# Patient Record
Sex: Male | Born: 1995 | Race: Black or African American | Hispanic: No | Marital: Single | Smoking: Never smoker
Health system: Southern US, Community
[De-identification: ages and names within clinical notes are randomized; demographics above are authoritative.]

---

## 2013-08-02 ENCOUNTER — Emergency Department (HOSPITAL_COMMUNITY)
Admission: EM | Admit: 2013-08-02 | Discharge: 2013-08-02 | Disposition: A | Discharge status: Home / Self Care | Payer: Self-pay | Attending: Emergency Medicine | Admitting: Emergency Medicine

## 2013-08-02 ENCOUNTER — Encounter (HOSPITAL_COMMUNITY): Payer: Self-pay | Admitting: Emergency Medicine

## 2013-08-02 DIAGNOSIS — R1013 Epigastric pain: Secondary | ICD-10-CM | POA: Insufficient documentation

## 2013-08-02 DIAGNOSIS — Z79899 Other long term (current) drug therapy: Secondary | ICD-10-CM | POA: Insufficient documentation

## 2013-08-02 DIAGNOSIS — R109 Unspecified abdominal pain: Secondary | ICD-10-CM

## 2013-08-02 LAB — URINALYSIS, ROUTINE W REFLEX MICROSCOPIC
Bilirubin Urine: NEGATIVE
Ketones, ur: NEGATIVE mg/dL
Leukocytes, UA: NEGATIVE
Nitrite: NEGATIVE
Protein, ur: NEGATIVE mg/dL
Specific Gravity, Urine: 1.025 (ref 1.005–1.030)
Urobilinogen, UA: 1 mg/dL (ref 0.0–1.0)
pH: 6 (ref 5.0–8.0)

## 2013-08-02 LAB — CBC WITH DIFFERENTIAL/PLATELET
Basophils Absolute: 0 10*3/uL (ref 0.0–0.1)
Basophils Relative: 0 % (ref 0–1)
Eosinophils Absolute: 0.6 10*3/uL (ref 0.0–0.7)
Lymphs Abs: 1.6 10*3/uL (ref 0.7–4.0)
MCH: 25.8 pg — ABNORMAL LOW (ref 26.0–34.0)
MCHC: 32.7 g/dL (ref 30.0–36.0)
MCV: 78.9 fL (ref 78.0–100.0)
Neutro Abs: 4.3 10*3/uL (ref 1.7–7.7)
Neutrophils Relative %: 58 % (ref 43–77)
Platelets: 252 10*3/uL (ref 150–400)
RBC: 5.59 MIL/uL — ABNORMAL HIGH (ref 3.87–5.11)

## 2013-08-02 LAB — COMPREHENSIVE METABOLIC PANEL
Albumin: 3.9 g/dL (ref 3.5–5.2)
Alkaline Phosphatase: 89 U/L (ref 39–117)
BUN: 10 mg/dL (ref 6–23)
CO2: 29 mEq/L (ref 19–32)
Creatinine, Ser: 1.01 mg/dL (ref 0.50–1.10)
GFR calc Af Amer: >90 mL/min (ref >90)
Glucose, Bld: 100 mg/dL — ABNORMAL HIGH (ref 70–99)
Potassium: 3.8 mEq/L (ref 3.5–5.1)
Total Protein: 7.2 g/dL (ref 6.0–8.3)

## 2013-08-02 LAB — LIPASE, BLOOD: Lipase: 20 U/L (ref 11–59)

## 2013-08-02 MED ORDER — OMEPRAZOLE 20 MG PO CPDR: 20.0000 mg | DELAYED_RELEASE_CAPSULE | Freq: Every day | ORAL | Status: DC

## 2013-08-02 NOTE — ED Notes (Signed)
Pt discharged.Vital signs stable and GCS 15.Discharge instruction given. 

## 2013-08-02 NOTE — ED Notes (Signed)
Pt presents with 2 day h/o intermittent epigastric pain.  Pt reports pain does not radiate, denies any nausea, vomiting or diarrhea.

## 2013-08-02 NOTE — ED Provider Notes (Signed)
CSN: [id number]     Arrival date & time 08/02/13  1138 History   First MD Initiated Contact with Patient 08/02/13 1152     Chief Complaint  Patient presents with  . Abdominal Pain    Patient is a 17 y.o. male presenting with abdominal pain.  Abdominal Pain Pain location:  Epigastric Pain quality: aching   Pain radiates to:  Does not radiate Pain severity:  Mild Onset quality:  Gradual Duration:  3 days Timing:  Constant (it resolved yesterday but started agin this morning) Context: alcohol use (occsnl but none recently)   Context: not recent illness and not recent travel   Relieved by:  Nothing Worsened by:  Nothing tried Ineffective treatments:  None tried Associated symptoms: no anorexia, no cough, no diarrhea, no dysuria, no fever, no hematuria, no nausea, no shortness of breath and no vomiting     History reviewed. No pertinent past medical history. History reviewed. No pertinent past surgical history. History reviewed. No pertinent family history. History  Substance Use Topics  . Smoking status: Never Smoker   . Smokeless tobacco: Not on file  . Alcohol Use: Yes   OB History   Grav Para Term Preterm Abortions TAB SAB Ect Mult Living                 Review of Systems  Constitutional: Negative for fever.  Respiratory: Negative for cough and shortness of breath.   Gastrointestinal: Positive for abdominal pain. Negative for nausea, vomiting, diarrhea and anorexia.  Genitourinary: Negative for dysuria and hematuria.  All other systems reviewed and are negative.    Allergies  Review of patient's allergies indicates no known allergies.  Home Medications   Current Outpatient Rx  Name  Route  Sig  Dispense  Refill  . omeprazole (PRILOSEC) 20 MG capsule   Oral   Take 1 capsule (20 mg total) by mouth daily.   14 capsule   0    BP 140/70  Pulse 90  Temp(Src) 98.6 F (37 C) (Oral)  Resp 20  Ht 5\' 9"  (1.753 m)  Wt 130 lb (58.968 kg)  BMI 19.19 kg/m2   SpO2 99% Physical Exam  Nursing note and vitals reviewed. Constitutional: She appears well-developed and well-nourished. No distress.  HENT:  Head: Normocephalic and atraumatic.  Right Ear: External ear normal.  Left Ear: External ear normal.  Eyes: Conjunctivae are normal. Right eye exhibits no discharge. Left eye exhibits no discharge. No scleral icterus.  Neck: Neck supple. No tracheal deviation present.  Cardiovascular: Normal rate, regular rhythm and intact distal pulses.   Pulmonary/Chest: Effort normal and breath sounds normal. No stridor. No respiratory distress. She has no wheezes. She has no rales.  Abdominal: Soft. Bowel sounds are normal. She exhibits no distension. There is no tenderness. There is no rebound and no guarding.  Musculoskeletal: She exhibits no edema and no tenderness.  Neurological: She is alert. She has normal strength. No sensory deficit. Cranial nerve deficit:  no gross defecits noted. She exhibits normal muscle tone. She displays no seizure activity. Coordination normal.  Skin: Skin is warm and dry. No rash noted.  Psychiatric: She has a normal mood and affect.    ED Course  Procedures (including critical care time) Labs Review Labs Reviewed  COMPREHENSIVE METABOLIC PANEL - Abnormal; Notable for the following:    Glucose, Bld 100 (*)    GFR calc non Af Amer 80 (*)    All other components within normal limits  CBC  WITH DIFFERENTIAL - Abnormal; Notable for the following:    RBC 5.59 (*)    MCH 25.8 (*)    Monocytes Relative 13 (*)    Eosinophils Relative 8 (*)    All other components within normal limits  LIPASE, BLOOD  URINALYSIS, ROUTINE W REFLEX MICROSCOPIC   Imaging Review No results found.  EKG Interpretation   None       MDM   1. Abdominal pain     Patient's exam is benign. His laboratory tests are unremarkable.  Is not having any trouble with his appetite. He is not having any nausea or vomiting. We'll discharge the patient home  on a course of antacids to see if that helps him at all. I recommend followup with primary-care Dr. if the symptoms persist    Celene Kras, MD 08/02/13 1358

## 2015-11-15 ENCOUNTER — Emergency Department (HOSPITAL_COMMUNITY): Payer: Self-pay

## 2015-11-15 ENCOUNTER — Emergency Department (HOSPITAL_COMMUNITY)
Admission: EM | Admit: 2015-11-15 | Discharge: 2015-11-15 | Disposition: A | Discharge status: Home / Self Care | Payer: Self-pay | Attending: Emergency Medicine | Admitting: Emergency Medicine

## 2015-11-15 ENCOUNTER — Encounter (HOSPITAL_COMMUNITY): Payer: Self-pay | Admitting: Emergency Medicine

## 2015-11-15 DIAGNOSIS — Z79899 Other long term (current) drug therapy: Secondary | ICD-10-CM | POA: Insufficient documentation

## 2015-11-15 DIAGNOSIS — R519 Headache, unspecified: Secondary | ICD-10-CM

## 2015-11-15 DIAGNOSIS — Z8679 Personal history of other diseases of the circulatory system: Secondary | ICD-10-CM | POA: Insufficient documentation

## 2015-11-15 DIAGNOSIS — M542 Cervicalgia: Secondary | ICD-10-CM | POA: Insufficient documentation

## 2015-11-15 DIAGNOSIS — R5381 Other malaise: Secondary | ICD-10-CM | POA: Insufficient documentation

## 2015-11-15 DIAGNOSIS — R51 Headache: Secondary | ICD-10-CM | POA: Insufficient documentation

## 2015-11-15 DIAGNOSIS — H53149 Visual discomfort, unspecified: Secondary | ICD-10-CM | POA: Insufficient documentation

## 2015-11-15 LAB — CBC WITH DIFFERENTIAL/PLATELET
BASOS ABS: 0 10*3/uL (ref 0.0–0.1)
Basophils Relative: 0 %
EOS PCT: 5 %
Eosinophils Absolute: 0.2 10*3/uL (ref 0.0–0.7)
HCT: 43.2 % (ref 39.0–52.0)
Hemoglobin: 14 g/dL (ref 13.0–17.0)
Lymphocytes Relative: 34 %
Lymphs Abs: 1.7 10*3/uL (ref 0.7–4.0)
MCH: 26 pg (ref 26.0–34.0)
MCHC: 32.4 g/dL (ref 30.0–36.0)
MCV: 80.1 fL (ref 78.0–100.0)
Monocytes Absolute: 0.6 10*3/uL (ref 0.1–1.0)
Monocytes Relative: 12 %
Neutro Abs: 2.4 10*3/uL (ref 1.7–7.7)
Neutrophils Relative %: 50 %
PLATELETS: 279 10*3/uL (ref 150–400)
RBC: 5.39 MIL/uL (ref 4.22–5.81)
RDW: 14.1 % (ref 11.5–15.5)
WBC: 4.9 10*3/uL (ref 4.0–10.5)

## 2015-11-15 LAB — BASIC METABOLIC PANEL
Anion gap: 7 (ref 5–15)
BUN: 12 mg/dL (ref 6–20)
CO2: 26 mmol/L (ref 22–32)
CREATININE: 0.95 mg/dL (ref 0.61–1.24)
Calcium: 9.3 mg/dL (ref 8.9–10.3)
Chloride: 105 mmol/L (ref 101–111)
GFR calc Af Amer: >60 mL/min (ref >60)
GFR calc non Af Amer: >60 mL/min (ref >60)
Glucose, Bld: 87 mg/dL (ref 65–99)
Potassium: 3.9 mmol/L (ref 3.5–5.1)
SODIUM: 138 mmol/L (ref 135–145)

## 2015-11-15 LAB — URINALYSIS, ROUTINE W REFLEX MICROSCOPIC
Bilirubin Urine: NEGATIVE
Glucose, UA: NEGATIVE mg/dL
Hgb urine dipstick: NEGATIVE
KETONES UR: NEGATIVE mg/dL
LEUKOCYTES UA: NEGATIVE
Nitrite: NEGATIVE
Protein, ur: NEGATIVE mg/dL
Specific Gravity, Urine: 1.02 (ref 1.005–1.030)
pH: 8 (ref 5.0–8.0)

## 2015-11-15 MED ORDER — DEXAMETHASONE SODIUM PHOSPHATE 10 MG/ML IJ SOLN
10.0000 mg | Freq: Once | INTRAMUSCULAR | Status: AC
  Administered 2015-11-15: 10 mg via INTRAVENOUS
  Filled 2015-11-15: qty 1

## 2015-11-15 MED ORDER — DIPHENHYDRAMINE HCL 50 MG/ML IJ SOLN
25.0000 mg | Freq: Once | INTRAMUSCULAR | Status: AC
  Administered 2015-11-15: 25 mg via INTRAVENOUS
  Filled 2015-11-15: qty 1

## 2015-11-15 MED ORDER — IBUPROFEN 800 MG PO TABS: 800.0000 mg | ORAL_TABLET | Freq: Three times a day (TID) | ORAL | Status: AC

## 2015-11-15 MED ORDER — LIDOCAINE VISCOUS 2 % MT SOLN: 15.0000 mL | Freq: Once | OROMUCOSAL | Status: DC

## 2015-11-15 MED ORDER — METOCLOPRAMIDE HCL 5 MG/ML IJ SOLN
5.0000 mg | Freq: Once | INTRAMUSCULAR | Status: AC
  Administered 2015-11-15: 5 mg via INTRAVENOUS
  Filled 2015-11-15: qty 2

## 2015-11-15 NOTE — Discharge Instructions (Signed)
General Headache Without Cause A headache is pain or discomfort felt around the head or neck area. The specific cause of a headache may not be found. There are many causes and types of headaches. A few common ones are:  Tension headaches.  Migraine headaches.  Cluster headaches.  Chronic daily headaches. HOME CARE INSTRUCTIONS  Watch your condition for any changes. Take these steps to help with your condition: Managing Pain  Take over-the-counter and prescription medicines only as told by your health care provider.  Lie down in a dark, quiet room when you have a headache.  If directed, apply ice to the head and neck area:  Put ice in a plastic bag.  Place a towel between your skin and the bag.  Leave the ice on for 20 minutes, 2-3 times per day.  Use a heating pad or hot shower to apply heat to the head and neck area as told by your health care provider.  Keep lights dim if bright lights bother you or make your headaches worse. Eating and Drinking  Eat meals on a regular schedule.  Limit alcohol use.  Decrease the amount of caffeine you drink, or stop drinking caffeine. General Instructions  Keep all follow-up visits as told by your health care provider. This is important.  Keep a headache journal to help find out what may trigger your headaches. For example, write down:  What you eat and drink.  How much sleep you get.  Any change to your diet or medicines.  Try massage or other relaxation techniques.  Limit stress.  Sit up straight, and do not tense your muscles.  Do not use tobacco products, including cigarettes, chewing tobacco, or e-cigarettes. If you need help quitting, ask your health care provider.  Exercise regularly as told by your health care provider.  Sleep on a regular schedule. Get 7-9 hours of sleep, or the amount recommended by your health care provider. SEEK MEDICAL CARE IF:   Your symptoms are not helped by medicine.  You have a  headache that is different from the usual headache.  You have nausea or you vomit.  You have a fever. SEEK IMMEDIATE MEDICAL CARE IF:   Your headache becomes severe.  You have repeated vomiting.  You have a stiff neck.  You have a loss of vision.  You have problems with speech.  You have pain in the eye or ear.  You have muscular weakness or loss of muscle control.  You lose your balance or have trouble walking.  You feel faint or pass out.  You have confusion.   This information is not intended to replace advice given to you by your health care provider. Make sure you discuss any questions you have with your health care provider.   Document Released: 09/20/2005 Document Revised: 06/11/2015 Document Reviewed: 01/13/2015 Elsevier Interactive Patient Education 2016 [employer].   Emergency Department Resource Guide 1) Find a Doctor and Pay Out of Pocket Although you won't have to find out who is covered by your insurance plan, it is a good idea to ask around and get recommendations. You will then need to call the office and see if the doctor you have chosen will accept you as a new patient and what types of options they offer for patients who are self-pay. Some doctors offer discounts or will set up payment plans for their patients who do not have insurance, but you will need to ask so you aren't surprised when you get to your  appointment.  2) Contact Your Local Health Department Not all health departments have doctors that can see patients for sick visits, but many do, so it is worth a call to see if yours does. If you don't know where your local health department is, you can check in your phone book. The CDC also has a tool to help you locate your state's health department, and many state websites also have listings of all of their local health departments.  3) Find a Walk-in Clinic If your illness is not likely to be very severe or complicated, you may want to try a walk  in clinic. These are popping up all over the country in pharmacies, drugstores, and shopping centers. They're usually staffed by nurse practitioners or physician assistants that have been trained to treat common illnesses and complaints. They're usually fairly quick and inexpensive. However, if you have serious medical issues or chronic medical problems, these are probably not your best option.  No Primary Care Doctor: - Call Health Connect at  [phone] - they can help you locate a primary care doctor that  accepts your insurance, provides certain services, etc. - Physician Referral Service- [phone]  Chronic Pain Problems: Organization         Address  Phone   Notes  [facility]  [phone] Patients need to be referred by their primary care doctor.   Medication Assistance: Organization         Address  Phone   Notes  [facility] Medication [facility] [address] [place], [place] [address] [phone] --Must be a resident of [facility] -- Must have NO insurance coverage whatsoever (no Medicaid/ Medicare, etc.) -- The pt. MUST have a primary care doctor that directs their care regularly and follows them in the community   [facility]ist  [phone]   [employer]  [phone]    Agencies that provide inexpensive medical care: Organization         Address  Phone   Notes  [facility]  [phone]   [facility]    [phone]   [facility] [address] [place], [place] [address] [phone]   [facility] [address] [place], [place] [phone]   Planned Parenthood    [phone]   [facility]    [phone]   Community Health and [facility]  [address], [place] Phone:  [phone], Fax:  [phone] Hours of Operation:  9 am - 6 pm, M-F.  Also accepts Medicaid/Medicare and self-pay.  [facility] for [facility]  [address], [place] Phone: [phone], Fax: [phone]. Hours of Operation:  8:30 am - 5:30 pm, M-F.  Also accepts Medicaid and self-pay.  [facility] [place] [address], [place] Phone: [phone]   [facility] [address] Natasha Bence [place], [place] [phone], Ext. [phone] Mondays & Thursdays: 7-9 AM.  First 15 patients are seen on a first come, first serve basis.    Medicaid-accepting [facility] Providers:  Organization         Address  Phone   Notes  [facility] [address], [place] [phone] Also accepts self-pay patients.  [facility] [address] Laurell Josephs [place], [place]  [phone]   [facility] [address], [place] [phone]) [phone]   [facility] [address] [place] [address], [place] [phone]) [phone]   Renaye RakersVeita Bland [address], [place]   [phone]) [phone] Only accepts [place] Access [place] patients after they have their name applied to their card.   Self-Pay (no insurance) in [facility]:  Organization         Address  Phone   Notes  Sickle Cell Patients, [facility] [address], [place] [phone]) [phone]   [facility] [address], [place] [phone]) [phone]   [facility] [place]  [address], [place] [phone]   [facility]/Dr. Osei-Bonsu  [address] [place] [address], [place] or [phone], [place] [phone] Phone number for both [place] and [place] locations is the same.  [facility] and [facility] [address], [place] [phone]) [phone]   [facility] [place] [address] [place] [address], [place] or [address] [phone](336) [phone]   [facility] [address], [place] [phone]) [phone], phone; [phone](336) [phone], fax Sees  patients 1st and 3rd Saturday of every month.  Must not qualify for public or private insurance (i.e. Medicaid, Medicare, [place] Health Choice, Veterans' Benefits)  Household income should be no more than 200% of the poverty level The clinic cannot treat you if you are pregnant or think you are pregnant  Sexually transmitted diseases are not treated at the clinic.    Dental Care: Organization         Address  Phone  Notes  [facility] Department of [facility] [address], [place] [phone]) [phone] Accepts children up to age 321 who are enrolled in [place] or [place] Health Choice; pregnant women with a Medicaid card; and children who have applied for Medicaid or [place] Health Choice, but were declined, whose parents can pay a reduced fee at time of service.  [facility] [place]  [address], [place] [phone]) [phone] Accepts children up to age 22 who are enrolled in [place] or [place] Health Choice; pregnant women with a Medicaid card; and children who have applied for Medicaid or  Health Choice, but were declined, whose parents can pay a reduced fee at time of service.  [facility]  [address], [place] [phone]) [phone] Patients are seen by appointment only. Walk-ins are not accepted. [facility] will see patients 22 years of age and older. Monday - Tuesday (8am-5pm) Most Wednesdays (8:30-5pm) $30 per visit, cash only  [facility]  [address], [facility] [place] [phone] Patients are seen by appointment only. Walk-ins are not accepted. [facility] will see patients 22 years of age and older. One Wednesday Evening (Monthly: Volunteer Based).  $30 per visit, cash only  [employer] [facility]  [phone] for adults; Children under age 524, call Graduate Pediatric Dentistry at [phone]. Children aged 334-14, please call [phone] to request a  pediatric application.  Dental services are provided in all areas of dental care including fillings, crowns and bridges, complete and partial dentures, implants, gum treatment, root canals, and extractions. Preventive care is also provided. Treatment is provided to both adults and children. Patients are selected via a lottery and there is often a waiting list.   [facility] [address], Ginette Otto  [phone] [email or web address]   [facility] [address] [place], [place] [phone], Ext. [phone] Second and Fourth Thursday of each month, opens at 6:30 AM; Clinic ends at 9 AM.  Patients are seen on a first-come first-served basis, and a limited number are seen during each clinic.   [facility]  [address] Ether Griffins [place], [place] [phone]   Eligibility Requirements You must have lived in [place], [place], or [place] counties for at least the last three months.   You cannot be eligible for state or federal sponsored [place], including [employer], [place], or [employer].   You generally cannot be eligible for healthcare insurance through your employer.    How to apply: Eligibility screenings are held every Tuesday and Wednesday afternoon from 1:00 pm until 4:00 pm. You do not need an appointment for the interview!  [facility] [address], [place], [place] [phone]   [facility] Health Department  [phone]   [facility] Health Department  [phone]   [facility] Health Department  [phone]    Behavioral Health Resources in the Community: Intensive Outpatient Programs Organization         Address  Phone  Notes  [facility] [address] [place], [place] [phone]   [facility] Outpatient [address], [place], [place] [phone]   [facility]: [facility] [address], [place], [place]  [phone]   [facility]  Mental Health [address]  [place], [place] [phone] or [phone]   Substance Abuse Resources Organization         Address  Phone  Notes  Al[facility] Drug Services  [phone]   [facility]  [phone]   [facility]  [phone]   Floydene Flock  [phone]   Residential & Outpatient Substance Abuse Program  [phone]   Psychological Services Organization         Address  Phone  Notes  [facility]  [phone]   [facility]  [phone]   [facility] [address], [place] [phone] or [phone]    Mobile Crisis Teams Organization         Address  Phone  Notes  [facility], Mobile Crisis Care Unit  [phone]   [facility]  [address] [place], [place] [phone]   Doristine Locks [address] [place] [phone]    Self-Help/Support Groups Organization         Address  Phone             Notes  [facility] [place] - variety of support groups  336- I7437963 Call for more information  Narcotics Anonymous (NA), [facility] [address], [employer] [place]  2 meetings at this location   Statistician         Address  Phone  Notes  [facility] [address],    [place]  [phone]   [facility]  [address] [place] [address], [place], [place] [phone]   [facility] [address] [place], [place] [phone] Admissions: 8am-3pm M-F  [facility] [address],    [place], [place] [phone]   [facility] [address] [place], [place], [place] [phone]   [facility] [address]  [place], [place] [phone]  [facility] - Intensive Outpatient [address], [place], [place] [phone]   [facility] ([facility] [address]    [place], [place] [phone] or [phone]   [facility] ([facility]) [address] [place], [place] [phone] Accepts Medicaid  [facility] [address]  [place] [phone] Substance Abuse/Addiction Treatment   [facility]al Health Resources Organization         Address  Phone  Notes  [facility]  [phone]) [phone]   Angie FavaJulie Brannon, PhD [address], [address] [place], [place]   [phone]) [phone] or [phone](336) [phone]   [facility]   [address] [place], [place] [phone](336) [phone]   [facility] [address], [place], [place] [phone]) [phone] Insurance/Medicaid/sponsorship through Dr. [facility]  [facility] [address]                                    [place], [place] [phone]) [phone] Therapy/tele-psych/case  [facility] [address]   [place], [place] [phone](336) [phone]    Dr. Lolly MustacheArfeen  [phone]) [phone]   [facility] [place]  [employer] [facility] 1) [address], [place] 2) [address], [place] 3)  [address], [place] [phone]) [phone](336) [phone]  [phone](336) [phone]   [facility] Child Abuse Hotline [phone]) [phone] or [phone](336) [phone] (After Hours)

## 2015-11-15 NOTE — ED Notes (Signed)
Pt complaint of continued headache for 3 weeks; unrelieved by motrin.

## 2015-11-15 NOTE — ED Provider Notes (Signed)
CSN: [id number]     Arrival date & time 11/15/15  1304 History  By signing my name below, I, [facility], attest that this documentation has been prepared under the direction and in the presence of William Fowler, PA-C. Electronically Signed: Randell [facility], ED Scribe. 11/15/2015. 3:26 PM.   Chief Complaint  Ashley presents with  . Headache   The history is provided by the Ashley. No language interpreter was used.   HPI Comments: William [facility] is a 20 y.o. male who presents to the Emergency Department complaining of intermittent, mild, ongoing, aching generalized HAs, occassionally worse in bilateral temples onset 3 weeks ago. Ashley reports no recent falls or injuries but states that he has been having difficulty focusing particularly in class. He endorses associated mild photophobia, general malaise, intermittent neck pain, and "feeling off balance" sometimes. Pain is worse with leaning forward and improved by nothing. He has taken ibuprofen 2 pills BID without relief. Per Ashley, he notes hx of migraines in the past when he was child, but notes that current complaint is longer in duration. He denies fever, neck stiffness, nausea, vomiting, double vision, abdominal pain, dysuria, hematuria, and sleep disturbance.  Onset 2-3 weeks ago. Denies recent injuries or falls. History reviewed. No pertinent past medical history. History reviewed. No pertinent past surgical history. No family history on file. Social History  Substance Use Topics  . Smoking status: Never Smoker   . Smokeless tobacco: None  . Alcohol Use: Yes    Review of Systems All other systems negative except as noted in HPI.   Allergies  Review of Ashley's allergies indicates no known allergies.  Home Medications   Prior to Admission medications   Medication Sig Start Date End Date Taking? Authorizing Provider  omeprazole (PRILOSEC) 20 MG capsule Take 1 capsule (20 mg total) by mouth daily. 08/02/13   Linwood Dibbles, MD   BP 126/72 mmHg  Pulse 71  Temp(Src) 98.3 F (36.8 C) (Oral)  Resp 18  SpO2 100% Physical Exam  Constitutional: He is oriented to person, place, and time. He appears well-developed and well-nourished.  Non-toxic appearance. He does not have a sickly appearance. He does not appear ill.  HENT:  Head: Normocephalic and atraumatic.  Right Ear: External ear normal.  Left Ear: External ear normal.  Mouth/Throat: Uvula is midline, oropharynx is clear and moist and mucous membranes are normal. No trismus in the jaw. No uvula swelling. No tonsillar abscesses.  Eyes: Conjunctivae are normal. Pupils are equal, round, and reactive to light. No scleral icterus.  Neck: Normal range of motion. Neck supple. No tracheal deviation present.  Cardiovascular: Normal rate, regular rhythm and normal heart sounds.   No murmur heard. Pulmonary/Chest: Effort normal and breath sounds normal. No accessory muscle usage or stridor. No respiratory distress. He has no wheezes. He has no rhonchi. He has no rales.  Abdominal: Soft. Bowel sounds are normal. He exhibits no distension. There is no tenderness.  Musculoskeletal: Normal range of motion.  Lymphadenopathy:    He has no cervical adenopathy.  Neurological: He is alert and oriented to person, place, and time.  Mental Status:   AOx3.  Speech clear without dysarthria. Cranial Nerves:  I-not tested  II-PERRLA  III, IV, VI-EOMs intact  V-temporal and masseter strength intact  VII-symmetrical facial movements intact, no facial droop  VIII-hearing grossly intact bilaterally  IX, X-gag intact  XI-strength of sternomastoid and trapezius muscles 5/5  XII-tongue midline Motor:   Good muscle bulk and tone  Strength 5/5 bilaterally in upper and lower extremities   Cerebellar--intact RAMs, finger to nose intact bilaterally.  Gait normal  No pronator drift Sensory:  Intact in upper and lower extremities   Skin: Skin is warm and dry.  Psychiatric: He  has a normal mood and affect. His behavior is normal.    ED Course  Procedures   DIAGNOSTIC STUDIES: Oxygen Saturation is 100% on RA, normal by my interpretation.    COORDINATION OF CARE: 1:39 PM Discussed ordering a head CT and pt agreed. Will order IV, Reglan, Benadryl, Decadron, and labs. Discussed treatment plan with pt at bedside and pt agreed to plan.  Labs Review Labs Reviewed  BASIC METABOLIC PANEL  CBC WITH DIFFERENTIAL/PLATELET  URINALYSIS, ROUTINE W REFLEX MICROSCOPIC (NOT AT [facility])    Imaging Review Ct Head Wo Contrast  11/15/2015  CLINICAL DATA:  Three-week history of frontal region headache EXAM: CT HEAD WITHOUT CONTRAST TECHNIQUE: Contiguous axial images were obtained from the base of the skull through the vertex without intravenous contrast. COMPARISON:  None. FINDINGS: The ventricles are normal in size and configuration. There is no intracranial mass, hemorrhage, extra-axial fluid collection, or midline shift. Gray-white compartments are normal. No acute infarct evident. Bony calvarium appears intact. The mastoid air cells are clear. No intraorbital lesions are identified. There is probable cerumen in each external auditory canal. IMPRESSION: Probable cerumen in each external auditory canal. No intracranial mass, hemorrhage, or focal gray - white compartment lesions/acute appearing infarct. Electronically Signed   By: Bretta Bang III M.D.   On: 11/15/2015 14:33   I have personally reviewed and evaluated these images and lab results as part of my medical decision-making.   EKG Interpretation None      MDM   Final diagnoses:  Nonintractable headache, unspecified chronicity pattern, unspecified headache type    Ashley presents with generalized headache. Non focal neuro exam. No recent head trauma. No fever. Doubt meningitis. Doubt intracranial bleed. Doubt normal pressure hydrocephalus. Given lack of history of this type of headache, will obtain CT head, CBC  and BMP.  Will treat with migraine cocktail and reevaluate.  CT head unremarkable.  BMP and CBC unremarkable.  UA negative.    I suspect primary headaches.  Plan to discharge home with 800 mg ibuprofen TID PRN.  Follow up PCP.  Discussed return precautions.  Ashley agrees and acknowledges the above plan for discharge.   I personally performed the services described in this documentation, which was scribed in my presence. The recorded information has been reviewed and is accurate.   William Fowler, PA-C 11/15/15 1539  William Fowler, PA-C 11/15/15 1545  Richardean Canal, MD 11/16/15 [phone]

## 2017-01-29 IMAGING — CT CT HEAD W/O CM
2 series · 17 of 30 positions shown, 20 images · non-contrast
Comparison: None.

CLINICAL DATA: Three-week history of frontal region headache

EXAM:
CT HEAD WITHOUT CONTRAST
TECHNIQUE: Contiguous axial images were obtained from the base of the skull
through the vertex without intravenous contrast.

[Series 2: head w/o · axial · non-contrast · 0.45mm/px · z∈[-129,-9]mm · 9 of 31 slices shown, 12 images]
[im 4/31  brain]
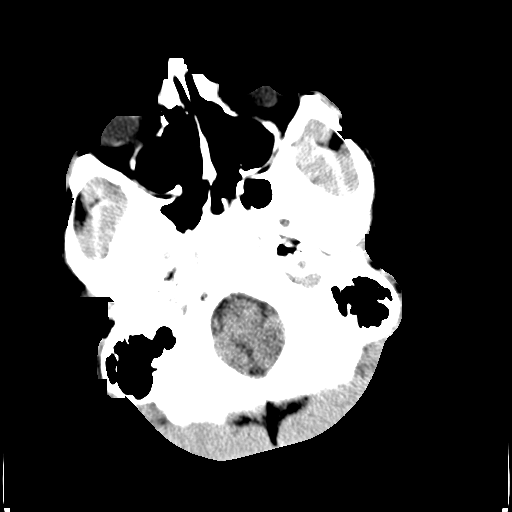
[im 4/31  bone]
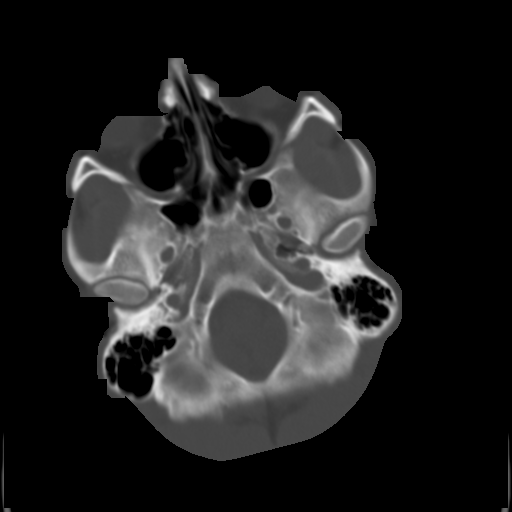
[im 7/31  brain]
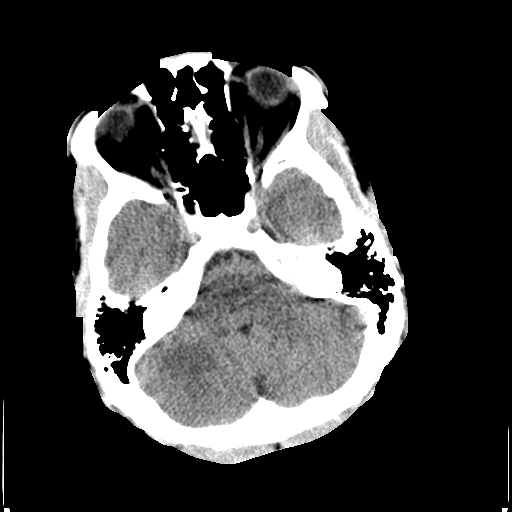
[im 10/31  brain]
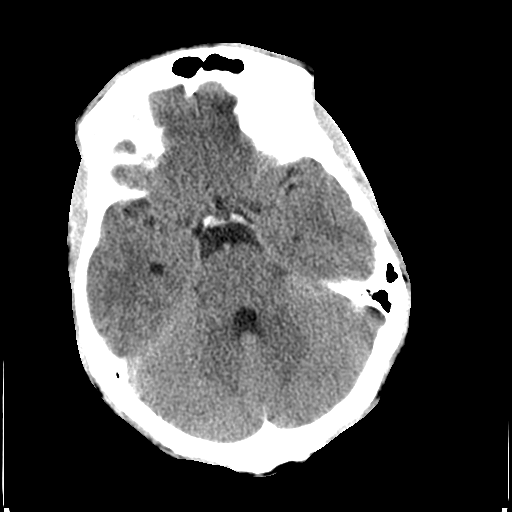
[im 13/31  brain]
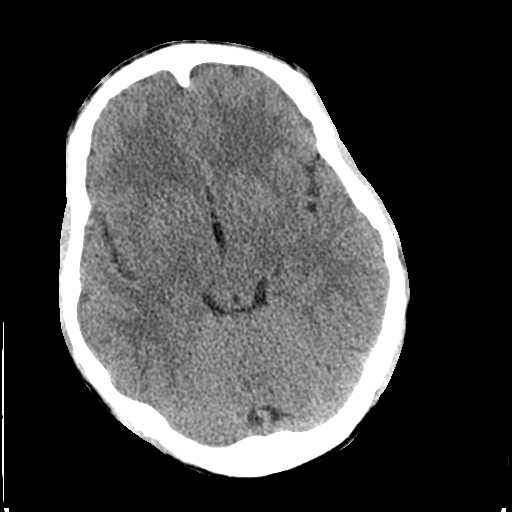
[im 16/31  brain]
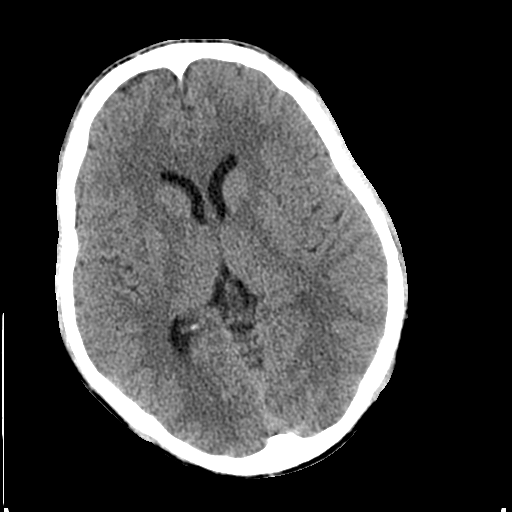
[im 16/31  bone]
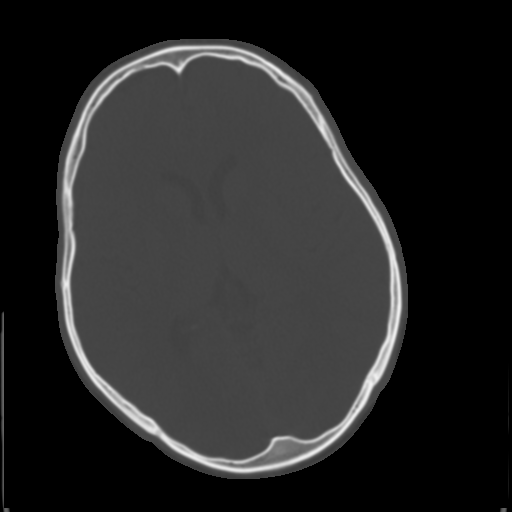
[im 19/31  brain]
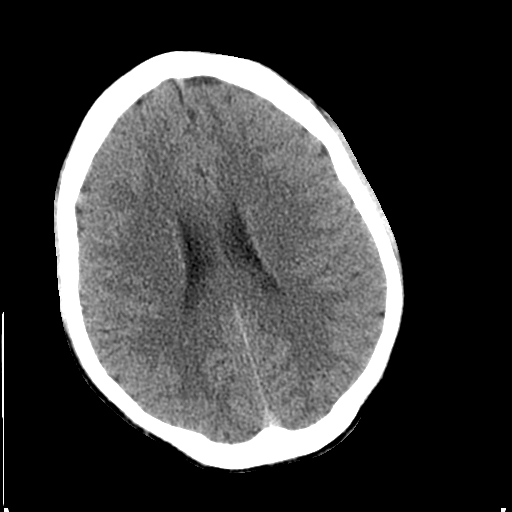
[im 22/31  brain]
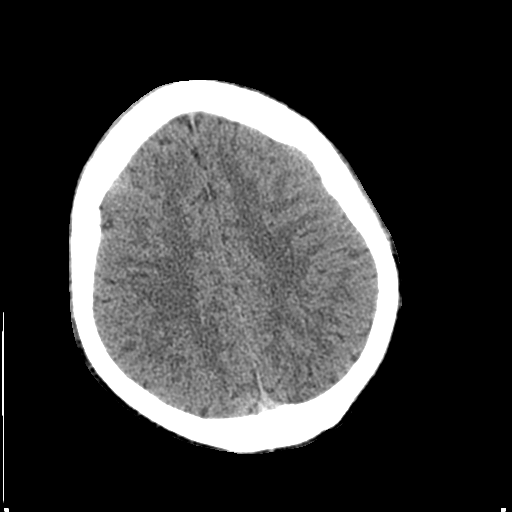
[im 25/31  brain]
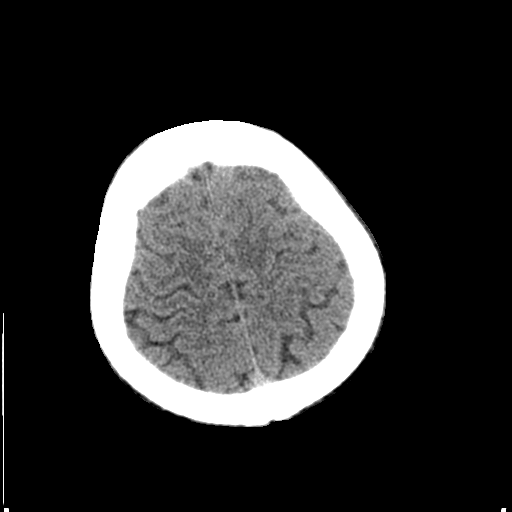
[im 28/31  brain]
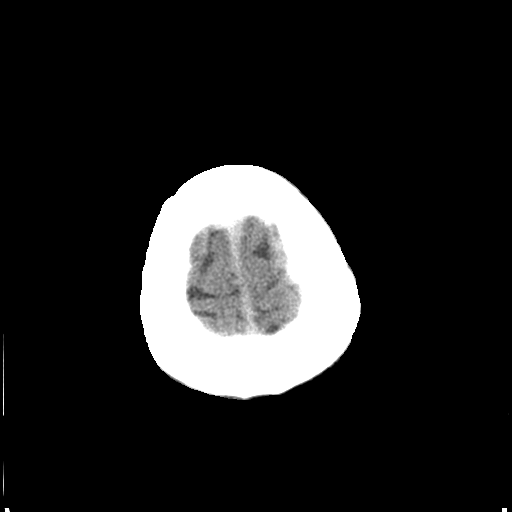
[im 28/31  bone]
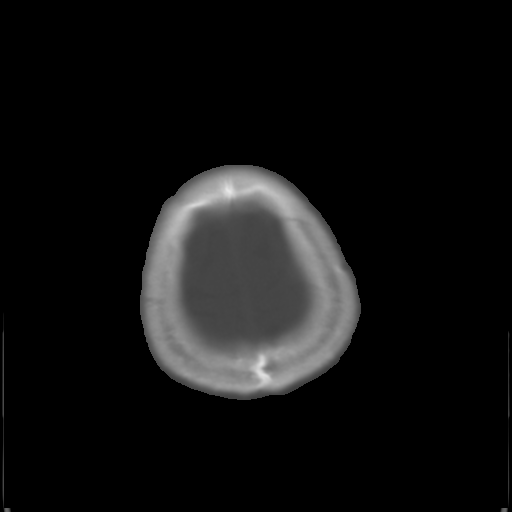

[Series 3: bone windows · axial · 0.45mm/px · z∈[-129,-9]mm · 8 of 52 slices shown]
[im 6/52  bone]
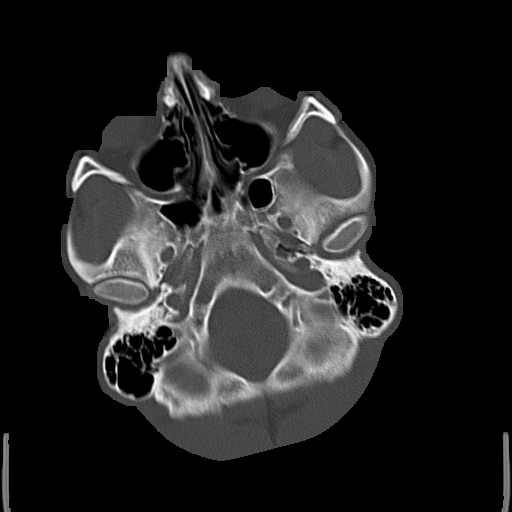
[im 12/52  bone]
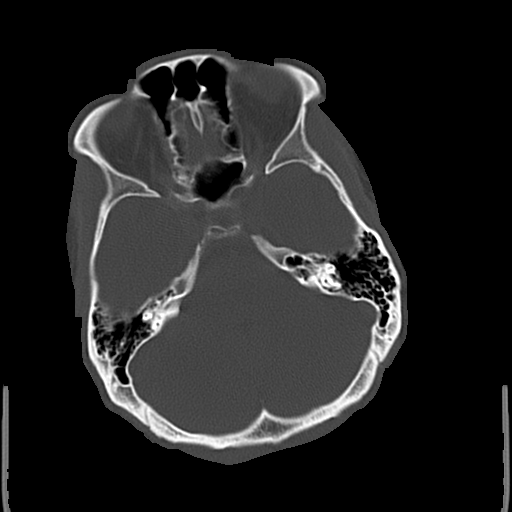
[im 18/52  bone]
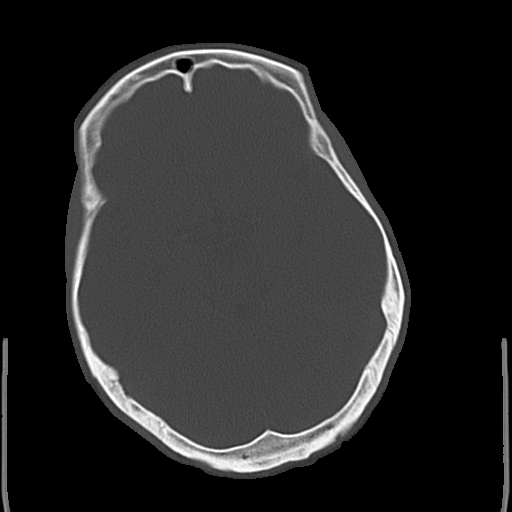
[im 23/52  bone]
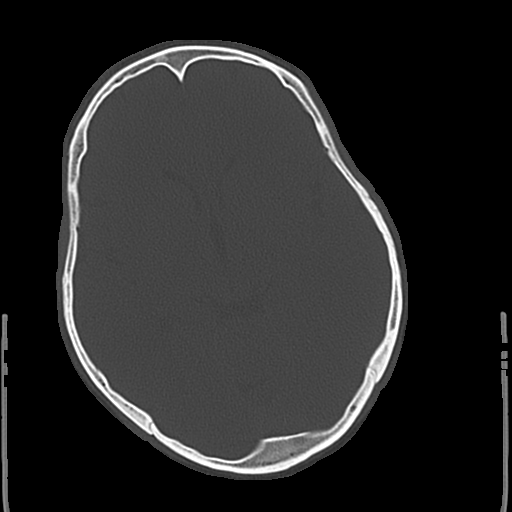
[im 29/52  bone]
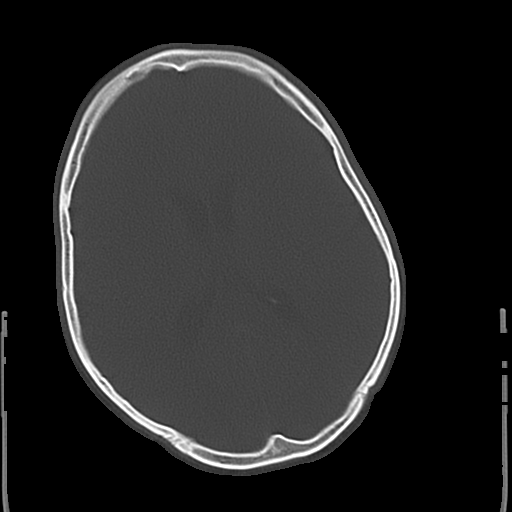
[im 35/52  bone]
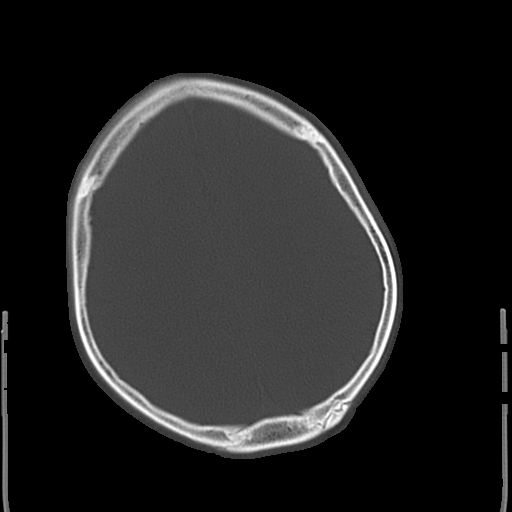
[im 40/52  bone]
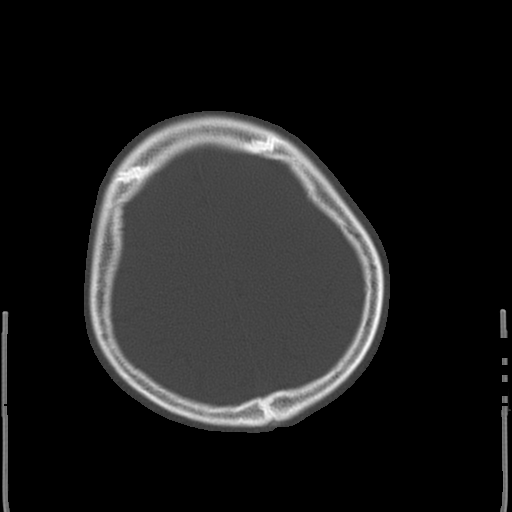
[im 46/52  bone]
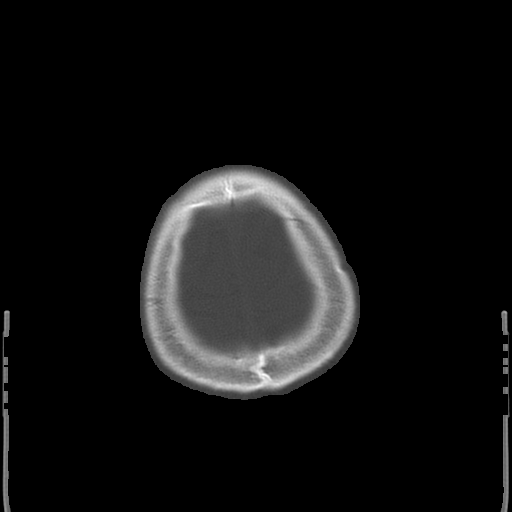

[17 of 30 positions shown; findings below may reference images not displayed]

FINDINGS: The ventricles are normal in size and configuration. There is no
intracranial mass, hemorrhage, extra-axial fluid collection, or
midline shift. Gray-white compartments are normal. No acute infarct
evident. Bony calvarium appears intact. The mastoid air cells are
clear. No intraorbital lesions are identified. There is probable
cerumen in each external auditory canal.
IMPRESSION: Probable cerumen in each external auditory canal. No intracranial
mass, hemorrhage, or focal gray - white compartment lesions/acute
appearing infarct.

## 2019-10-02 ENCOUNTER — Emergency Department (HOSPITAL_COMMUNITY)
Admission: EM | Admit: 2019-10-02 | Discharge: 2019-10-02 | Disposition: A | Discharge status: Home / Self Care | Payer: Self-pay | Attending: Emergency Medicine | Admitting: Emergency Medicine

## 2019-10-02 ENCOUNTER — Ambulatory Visit
Admission: EM | Admit: 2019-10-02 | Discharge: 2019-10-02 | Disposition: A | Discharge status: Home / Self Care | Payer: Self-pay | Attending: Emergency Medicine | Admitting: Emergency Medicine

## 2019-10-02 ENCOUNTER — Encounter: Payer: Self-pay | Admitting: Emergency Medicine

## 2019-10-02 ENCOUNTER — Encounter (HOSPITAL_COMMUNITY): Payer: Self-pay

## 2019-10-02 ENCOUNTER — Other Ambulatory Visit: Payer: Self-pay

## 2019-10-02 DIAGNOSIS — Y288XXA Contact with other sharp object, undetermined intent, initial encounter: Secondary | ICD-10-CM | POA: Insufficient documentation

## 2019-10-02 DIAGNOSIS — Z23 Encounter for immunization: Secondary | ICD-10-CM | POA: Insufficient documentation

## 2019-10-02 DIAGNOSIS — W458XXA Other foreign body or object entering through skin, initial encounter: Secondary | ICD-10-CM

## 2019-10-02 DIAGNOSIS — Y999 Unspecified external cause status: Secondary | ICD-10-CM | POA: Insufficient documentation

## 2019-10-02 DIAGNOSIS — Y93G1 Activity, food preparation and clean up: Secondary | ICD-10-CM | POA: Insufficient documentation

## 2019-10-02 DIAGNOSIS — Y92000 Kitchen of unspecified non-institutional (private) residence as  the place of occurrence of the external cause: Secondary | ICD-10-CM | POA: Insufficient documentation

## 2019-10-02 DIAGNOSIS — S61111A Laceration without foreign body of right thumb with damage to nail, initial encounter: Secondary | ICD-10-CM

## 2019-10-02 MED ORDER — TETANUS-DIPHTH-ACELL PERTUSSIS 5-2.5-18.5 LF-MCG/0.5 IM SUSP
0.5000 mL | Freq: Once | INTRAMUSCULAR | Status: AC
  Administered 2019-10-02: 19:00:00 0.5 mL via INTRAMUSCULAR
  Filled 2019-10-02: qty 0.5

## 2019-10-02 MED ORDER — LIDOCAINE HCL (PF) 1 % IJ SOLN
5.0000 mL | Freq: Once | INTRAMUSCULAR | Status: AC
  Administered 2019-10-02: 5 mL

## 2019-10-02 NOTE — ED Provider Notes (Signed)
..  Laceration Repair  Date/Time: 10/02/2019 6:09 PM Performed by: Maudie Flakes, MD Authorized by: Maudie Flakes, MD   Consent:    Consent obtained:  Verbal   Consent given by:  Patient   Risks discussed:  Pain, poor cosmetic result, poor wound healing, vascular damage, tendon damage and infection Laceration details:    Location:  Finger   Finger location:  R thumb   Length (cm):  1.5   Depth (mm):  2 Repair type:    Repair type:  Simple Pre-procedure details:    Preparation:  Patient was prepped and draped in usual sterile fashion Exploration:    Hemostasis achieved with:  Tourniquet   Wound exploration: wound explored through full range of motion and entire depth of wound probed and visualized     Contaminated: no   Treatment:    Area cleansed with:  Saline Skin repair:    Repair method:  Sutures   Suture size:  5-0   Wound skin closure material used: Fast-absorbing Vicryl.   Number of sutures:  1 Approximation:    Approximation:  Close Post-procedure details:    Dressing:  Open (no dressing)   Patient tolerance of procedure:  Tolerated well, no immediate complications Comments:     Single figure-of-eight stitch placed to achieve hemostasis of small arterial bleed within the wound.      Maudie Flakes, MD 10/02/19 [phone]

## 2019-10-02 NOTE — ED Triage Notes (Addendum)
Pt presents to [facility] for assessment of laceration to tip of right thumb after slicing his finger on a mandolin.  Area is actively bleeding even with applied pressure.  No underlying structure noted by this RN

## 2019-10-02 NOTE — ED Notes (Signed)
ED Provider at bedside. 

## 2019-10-02 NOTE — ED Provider Notes (Signed)
[facility] DEPT Provider Note   CSN: [id number] Arrival date & time: 10/02/19  1628     History Chief Complaint  Patient presents with  . Laceration    [facility] is a 23 y.o. male.  23 y.o male with no PMH presents to the ED sent in by [facility] for right thumb laceration repair.  Patient reports he was using a mandolin when he suddenly slid his right thumb onto the machine, this cut the tip of his right thumb, significant bleeding noted at the site.  He was seen at urgent care however due to being unable to control the bleeding patient was sent for further evaluation of wound.  He does describe pain around the area, he did have a digital block while at urgent care with improvement in his pain.  His last tetanus vaccine is unknown, denies any weakness, other trauma.  The history is provided by the patient.       History reviewed. No pertinent past medical history.  There are no problems to display for this patient.   History reviewed. No pertinent surgical history.     Family History  Problem Relation Age of Onset  . Healthy Mother   . Healthy Father     Social History   Tobacco Use  . Smoking status: Never Smoker  . Smokeless tobacco: Never Used  Substance Use Topics  . Alcohol use: Yes  . Drug use: No    Home Medications Prior to Admission medications   Medication Sig Start Date End Date Taking? Authorizing Provider  ibuprofen (ADVIL,MOTRIN) 800 MG tablet Take 1 tablet (800 mg total) by mouth 3 (three) times daily. 11/15/15   Gloriann Loan, PA-C  omeprazole (PRILOSEC) 20 MG capsule Take 1 capsule (20 mg total) by mouth daily. 08/02/13 10/02/19  Dorie Rank, MD    Allergies    Patient has no known allergies.  Review of Systems   Review of Systems  Constitutional: Negative for fever.  Skin: Positive for wound.    Physical Exam Updated Vital Signs BP (!) 142/79 (BP Location: Right Arm)   Pulse 72   Temp 98.4 F (36.9 C)  (Oral)   Resp 16   Ht 5\' 9"  (1.753 m)   Wt 77.1 kg   SpO2 100%   BMI 25.10 kg/m   Physical Exam Vitals and nursing note reviewed.  Constitutional:      Appearance: He is well-developed.  HENT:     Head: Normocephalic and atraumatic.  Eyes:     General: No scleral icterus.    Pupils: Pupils are equal, round, and reactive to light.  Cardiovascular:     Heart sounds: Normal heart sounds.  Pulmonary:     Effort: Pulmonary effort is normal.     Breath sounds: Normal breath sounds. No wheezing.  Chest:     Chest wall: No tenderness.  Abdominal:     General: Bowel sounds are normal. There is no distension.     Palpations: Abdomen is soft.     Tenderness: There is no abdominal tenderness.  Musculoskeletal:        General: No deformity.     Right hand: Laceration and tenderness present. Normal range of motion. Normal strength. Normal sensation. There is no disruption of two-point discrimination. Normal capillary refill. Normal pulse.     Cervical back: Normal range of motion.     Comments: Pulses present, actively bleeding wound, pressure dressing applied.  Full range of motion without pain.  Skin:    General: Skin is warm and dry.  Neurological:     Mental Status: He is alert and oriented to person, place, and time.         ED Results / Procedures / Treatments   Labs (all labs ordered are listed, but only abnormal results are displayed) Labs Reviewed - No data to display  EKG None  Radiology No results found.  Procedures Procedures (including critical care time)  Medications Ordered in ED Medications  Tdap (BOOSTRIX) injection 0.5 mL (has no administration in time range)    ED Course  I have reviewed the triage vital signs and the nursing notes.  Pertinent labs & imaging results that were available during my care of the patient were reviewed by me and considered in my medical decision making (see chart for details).    MDM Rules/Calculators/A&P  Patient  without any past medical history presents to the ED with complaints of right thumb laceration, seen at urgent care prior to arrival in the ED.  He has full range of motion of the right thumb, a tourniquet was applied in order to further evaluate wound.  There is oozing noted from the tip of the right thumb, he does have full range of motion without any difficulty.  He did get digital block while at urgent care but sent here as bleeding was not controlled. Tetanus vaccine was updated during ED visit.  Patient had figured 8 stitch placed by my attending Dr. Pilar Plate he tolerated the procedure well without any complications. I will personally placed dermabond on top of wound in order to seal the wound. Patient was monitored while in the ED for a period of an hour, no significant bleeding noted after this.He has been educated on this, will provide him with return instructions. He understands and agrees with management.   Portions of this note were generated with Scientist, clinical (histocompatibility and immunogenetics). Dictation errors may occur despite best attempts at proofreading.  Final Clinical Impression(s) / ED Diagnoses Final diagnoses:  Laceration of right thumb without foreign body with damage to nail, initial encounter    Rx / DC Orders ED Discharge Orders    None       Claude Manges, PA-C 10/02/19 1901    Sabas Sous, MD 10/03/19 1827

## 2019-10-02 NOTE — Discharge Instructions (Signed)
Go directly to the ER for further treatment as you are still having active bleeding with deep laceration.

## 2019-10-02 NOTE — ED Provider Notes (Signed)
[facility]    CSN: [id number] Arrival date & time: 10/02/19  1324      History   Chief Complaint Chief Complaint  Patient presents with  . Laceration    HPI [facility] is a 23 y.o. male presenting for right thumb laceration after using a mandolin.  States this occurred 3 hours PTA.  Has been applying direct pressure without successful hemostasis.  History of prolonged bleeding.  No lightheadedness, dizziness.  Does endorse pain.  Unknown tetanus status.   History reviewed. No pertinent past medical history.  There are no problems to display for this patient.   History reviewed. No pertinent surgical history.     Home Medications    Prior to Admission medications   Medication Sig Start Date End Date Taking? Authorizing Provider  ibuprofen (ADVIL,MOTRIN) 800 MG tablet Take 1 tablet (800 mg total) by mouth 3 (three) times daily. 11/15/15   Gloriann Loan, PA-C  omeprazole (PRILOSEC) 20 MG capsule Take 1 capsule (20 mg total) by mouth daily. 08/02/13 10/02/19  Dorie Rank, MD    Family History Family History  Problem Relation Age of Onset  . Healthy Mother   . Healthy Father     Social History Social History   Tobacco Use  . Smoking status: Never Smoker  . Smokeless tobacco: Never Used  Substance Use Topics  . Alcohol use: Yes  . Drug use: No     Allergies   Patient has no known allergies.   Review of Systems Review of Systems  Constitutional: Negative for fatigue and fever.  Respiratory: Negative for cough and shortness of breath.   Cardiovascular: Negative for chest pain and palpitations.  Gastrointestinal: Negative for abdominal pain, diarrhea and vomiting.  Musculoskeletal: Negative for arthralgias and myalgias.  Skin: Positive for wound. Negative for rash.  Neurological: Negative for speech difficulty and headaches.  All other systems reviewed and are negative.    Physical Exam Triage Vital Signs ED Triage Vitals [10/02/19  1506]  Enc Vitals Group     BP 137/84     Pulse Rate 68     Resp 16     Temp 98.6 F (37 C)     Temp Source Oral     SpO2 99 %     Weight      Height      Head Circumference      Peak Flow      Pain Score      Pain Loc      Pain Edu?      Excl. in [place]?    No data found.  Updated Vital Signs BP 137/84 (BP Location: Left Arm)   Pulse 68   Temp 98.6 F (37 C) (Oral)   Resp 16   SpO2 99%   Visual Acuity Right Eye Distance:   Left Eye Distance:   Bilateral Distance:    Right Eye Near:   Left Eye Near:    Bilateral Near:     Physical Exam Constitutional:      General: He is not in acute distress.    Appearance: He is not ill-appearing or diaphoretic.  HENT:     Head: Normocephalic and atraumatic.  Eyes:     General: No scleral icterus.    Pupils: Pupils are equal, round, and reactive to light.  Cardiovascular:     Rate and Rhythm: Normal rate.  Pulmonary:     Effort: Pulmonary effort is normal. No respiratory distress.  Breath sounds: No wheezing.  Musculoskeletal:        General: Signs of injury present. Normal range of motion.  Skin:    Capillary Refill: Capillary refill takes less than 2 seconds.     Coloration: Skin is not jaundiced or pale.     Comments: Avulsion laceration over distal aspect of right thumb involving distal nail.  Patient actively bleeding, endorsing pain.  Sensation intact.  Neurological:     General: No focal deficit present.     Mental Status: He is alert and oriented to person, place, and time.     Sensory: No sensory deficit.     Motor: No weakness.      UC Treatments / Results  Labs (all labs ordered are listed, but only abnormal results are displayed) Labs Reviewed - No data to display  EKG   Radiology No results found.  Procedures Procedures (including critical care time)  Medications Ordered in UC Medications  lidocaine (PF) (XYLOCAINE) 1 % injection 5 mL (5 mLs Infiltration Given 10/02/19 1603)    Initial  Impression / Assessment and Plan / UC Course  I have reviewed the triage vital signs and the nursing notes.  Pertinent labs & imaging results that were available during my care of the patient were reviewed by me and considered in my medical decision making (see chart for details).     Actively bleeding, concerning for arterial involvement.  Digital block performed which patient tolerated well w/ pain improvement.  Pressure dressing applied by this provider in office which patient tolerated well.  Patient discharged to ER in stable condition. Final Clinical Impressions(s) / UC Diagnoses   Final diagnoses:  Laceration of right thumb without foreign body with damage to nail, initial encounter     Discharge Instructions     Go directly to the ER for further treatment as you are still having active bleeding with deep laceration.    ED Prescriptions    None     PDMP not reviewed this encounter.   [place], [place], [place] 10/03/19 1316

## 2019-10-02 NOTE — ED Triage Notes (Addendum)
Pt arrives today with a lac to the top of his right thumb. Pt was seen at [facility] and wound was wrapped there. Unknown last tetanus.

## 2019-10-02 NOTE — Discharge Instructions (Addendum)
We have placed 1 stitch to your right thumb, this will likely be absorbed by your body, do not attempt to remove this.  I have also placed Dermabond on top of the wound, this will follow up in the next 7 to 10 days.  This wound should heal on its own within the next 2 weeks, if your symptoms worsen, you experience bleeding, worsening pain you may return to the emergency department.

## 2019-10-02 NOTE — ED Notes (Signed)
Patient able to ambulate independently  

## 2019-10-02 NOTE — ED Notes (Signed)
Pt refused discharge vital signs

## 2024-07-12 ENCOUNTER — Other Ambulatory Visit: Payer: Self-pay

## 2024-07-12 ENCOUNTER — Emergency Department (HOSPITAL_BASED_OUTPATIENT_CLINIC_OR_DEPARTMENT_OTHER)
Admission: EM | Admit: 2024-07-12 | Discharge: 2024-07-12 | Disposition: A | Discharge status: Home / Self Care | Payer: Self-pay | Attending: Emergency Medicine | Admitting: Emergency Medicine

## 2024-07-12 ENCOUNTER — Emergency Department (HOSPITAL_BASED_OUTPATIENT_CLINIC_OR_DEPARTMENT_OTHER): Payer: Self-pay

## 2024-07-12 ENCOUNTER — Encounter (HOSPITAL_BASED_OUTPATIENT_CLINIC_OR_DEPARTMENT_OTHER): Payer: Self-pay

## 2024-07-12 DIAGNOSIS — W540XXA Bitten by dog, initial encounter: Secondary | ICD-10-CM | POA: Insufficient documentation

## 2024-07-12 DIAGNOSIS — S61250A Open bite of right index finger without damage to nail, initial encounter: Secondary | ICD-10-CM | POA: Insufficient documentation

## 2024-07-12 MED ORDER — OXYCODONE HCL 5 MG PO TABS: 5.0000 mg | ORAL_TABLET | ORAL | 0 refills | Status: AC | PRN

## 2024-07-12 MED ORDER — AMOXICILLIN-POT CLAVULANATE 875-125 MG PO TABS
1.0000 | ORAL_TABLET | Freq: Once | ORAL | Status: AC
  Administered 2024-07-12: 1 via ORAL
  Filled 2024-07-12: qty 1

## 2024-07-12 MED ORDER — AMOXICILLIN-POT CLAVULANATE 875-125 MG PO TABS: 1.0000 | ORAL_TABLET | Freq: Two times a day (BID) | ORAL | 0 refills | Status: AC

## 2024-07-12 MED ORDER — HYDROCODONE-ACETAMINOPHEN 5-325 MG PO TABS
1.0000 | ORAL_TABLET | Freq: Once | ORAL | Status: AC
  Administered 2024-07-12: 1 via ORAL
  Filled 2024-07-12: qty 1

## 2024-07-12 NOTE — Discharge Instructions (Addendum)
 You were seen for your finger injury in the emergency department.   At home, please take Tylenol and ibuprofen  for the pain. You may also take the oxycodone we have prescribed you for any breakthrough pain that may have.  Do not take this before driving or operating heavy machinery.  Do not take this medication with alcohol.  Ice your finger to make sure that the swelling does not get worse.  Try to keep it elevated when possible.  Take the antibiotics (Augmentin) to prevent infection.    Check your MyChart online for the results of any tests that had not resulted by the time you left the emergency department.   Follow-up with your primary doctor in 2-3 days regarding your visit.    Return immediately to the emergency department if you experience any of the following: Worsening pain, discharge from the wound, redness of the wound, or any other concerning symptoms.    Thank you for visiting our Emergency Department. It was a pleasure taking care of you today.

## 2024-07-12 NOTE — ED Notes (Signed)
 Pt. Has noted dog bite to his R forefinger with controlled bleeding.  Pt. Reports pain a 7/10,

## 2024-07-12 NOTE — ED Triage Notes (Addendum)
 Pt to ED for dog bite to R palm/index finger that happened earlier today. Pt states he was cleaning his dogs ear when the dog turned around and bit his hand. Puncture wounds x2 to pt R hand, bleeding controlled. Able to move fingers without difficulty reporting slight numbness to R index finger. Dog updated on all shots, pt last tetanus 5 years ago.

## 2024-07-12 NOTE — ED Notes (Signed)
 Cleaned Pt. Finger and dressed the finger.  Gave to go package for the Pt. To change dressing.

## 2024-07-12 NOTE — ED Provider Notes (Signed)
  [place] [facility] Provider Note   CSN: [id number] Arrival date & time: 07/12/24  1813     Patient presents with: Finger Injury and Animal Bite   William Ashley is a 28 y.o. male.  {Add pertinent medical, surgical, social history, OB history to HPI:7158} 28 year old male right-hand-dominant who presents emergency department with right index finger injury from a dog bite.  States that he was cleaning his dog's ears when the dog turned around and bit his right hand index finger.  Says that the dog is up-to-date on its vaccines.  Patient's last tetanus shot was within the last 5 years.  Since he is having some numbness to the outside of his finger.  Finger for several minutes prior to coming in.       Prior to Admission medications   Medication Sig Start Date End Date Taking? Authorizing Provider  ibuprofen  (ADVIL ,MOTRIN ) 800 MG tablet Take 1 tablet (800 mg total) by mouth 3 (three) times daily. 11/15/15   Rose, Kayla, PA-C  omeprazole  (PRILOSEC) 20 MG capsule Take 1 capsule (20 mg total) by mouth daily. 08/02/13 10/02/19  Randol Simmonds, MD    Allergies: Patient has no known allergies.    Review of Systems  Updated Vital Signs BP 135/88 (BP Location: Left Arm)   Pulse 67   Temp 97.9 F (36.6 C) (Oral)   Resp 16   Ht 5' 8 (1.727 m)   Wt 74.8 kg   SpO2 97%   BMI 25.09 kg/m   Physical Exam Musculoskeletal:     Comments: 1 cm laceration to the palmar aspect of the right index finger.  5 mm laceration to the dorsal aspect of the right index finger.  See image below.  Mild amount of swelling of the index finger.  No obvious deformities of the index finger.  Diminished sensation light touch on the radial aspect of the finger just distal to the laceration.  Capillary refill less than 2 seconds in the index finger.  Full range of motion of the MCP, PIP, DIP of the right index finger.  Full strength in flexion and extension of all of these joints.      (all labs ordered are listed, but only abnormal results are displayed) Labs Reviewed - No data to display  EKG: None  Radiology: No results found.  {Document cardiac monitor, telemetry assessment procedure when appropriate:32947} Procedures   Medications Ordered in the ED  amoxicillin-clavulanate (AUGMENTIN) 875-125 MG per tablet 1 tablet (has no administration in time range)      {Click here for ABCD2, HEART and other calculators REFRESH Note before signing:1}                              Medical Decision Making Amount and/or Complexity of Data Reviewed Radiology: ordered.  Risk Prescription drug management.   ***  {Document critical care time when appropriate  Document review of labs and clinical decision tools ie CHADS2VASC2, etc  Document your independent review of radiology images and any outside records  Document your discussion with family members, caretakers and with consultants  Document social determinants of health affecting pt's care  Document your decision making why or why not admission, treatments were needed:32947:::1}   Final diagnoses:  None    ED Discharge Orders     None
